# Patient Record
Sex: Female | Born: 1960 | Race: White | Hispanic: No | Marital: Married
Health system: Southern US, Community
[De-identification: ages and names within clinical notes are randomized; demographics above are authoritative.]

## PROBLEM LIST (undated history)

## (undated) HISTORY — PX: WISDOM TOOTH EXTRACTION: SHX21

## (undated) HISTORY — PX: ENDOMETRIAL ABLATION: SHX621

---

## 2011-01-17 ENCOUNTER — Other Ambulatory Visit: Payer: Self-pay | Admitting: Sports Medicine

## 2011-01-17 DIAGNOSIS — M546 Pain in thoracic spine: Secondary | ICD-10-CM

## 2011-01-20 ENCOUNTER — Ambulatory Visit
Admission: RE | Admit: 2011-01-20 | Discharge: 2011-01-20 | Disposition: A | Discharge status: Home / Self Care | Payer: BC Managed Care – PPO | Source: Ambulatory Visit | Attending: Sports Medicine | Admitting: Sports Medicine

## 2011-01-20 DIAGNOSIS — M546 Pain in thoracic spine: Secondary | ICD-10-CM

## 2011-03-25 ENCOUNTER — Other Ambulatory Visit: Payer: Self-pay | Admitting: Orthopedic Surgery

## 2011-04-02 ENCOUNTER — Ambulatory Visit
Admission: RE | Admit: 2011-04-02 | Discharge: 2011-04-02 | Disposition: A | Discharge status: Home / Self Care | Payer: BC Managed Care – PPO | Source: Ambulatory Visit | Attending: Orthopedic Surgery | Admitting: Orthopedic Surgery

## 2011-04-02 DIAGNOSIS — M40209 Unspecified kyphosis, site unspecified: Secondary | ICD-10-CM

## 2011-04-02 DIAGNOSIS — R9389 Abnormal findings on diagnostic imaging of other specified body structures: Secondary | ICD-10-CM

## 2014-07-28 ENCOUNTER — Other Ambulatory Visit: Payer: Self-pay | Admitting: Obstetrics and Gynecology

## 2014-07-28 DIAGNOSIS — N6452 Nipple discharge: Secondary | ICD-10-CM

## 2014-08-03 ENCOUNTER — Other Ambulatory Visit: Payer: BC Managed Care – PPO

## 2014-08-03 ENCOUNTER — Inpatient Hospital Stay: Admission: RE | Admit: 2014-08-03 | Payer: BC Managed Care – PPO | Source: Ambulatory Visit

## 2014-11-29 ENCOUNTER — Other Ambulatory Visit: Payer: Self-pay

## 2017-01-22 ENCOUNTER — Emergency Department (HOSPITAL_BASED_OUTPATIENT_CLINIC_OR_DEPARTMENT_OTHER): Payer: 59

## 2017-01-22 ENCOUNTER — Emergency Department (HOSPITAL_BASED_OUTPATIENT_CLINIC_OR_DEPARTMENT_OTHER)
Admission: EM | Admit: 2017-01-22 | Discharge: 2017-01-22 | Disposition: A | Discharge status: Home / Self Care | Payer: 59 | Attending: Physician Assistant | Admitting: Physician Assistant

## 2017-01-22 ENCOUNTER — Encounter (HOSPITAL_BASED_OUTPATIENT_CLINIC_OR_DEPARTMENT_OTHER): Payer: Self-pay | Admitting: *Deleted

## 2017-01-22 DIAGNOSIS — R51 Headache: Secondary | ICD-10-CM | POA: Insufficient documentation

## 2017-01-22 DIAGNOSIS — M79602 Pain in left arm: Secondary | ICD-10-CM | POA: Diagnosis not present

## 2017-01-22 DIAGNOSIS — R52 Pain, unspecified: Secondary | ICD-10-CM

## 2017-01-22 DIAGNOSIS — R079 Chest pain, unspecified: Secondary | ICD-10-CM | POA: Insufficient documentation

## 2017-01-22 DIAGNOSIS — M79605 Pain in left leg: Secondary | ICD-10-CM | POA: Insufficient documentation

## 2017-01-22 LAB — COMPREHENSIVE METABOLIC PANEL
ALT: 30 U/L (ref 14–54)
AST: 23 U/L (ref 15–41)
Albumin: 3.7 g/dL (ref 3.5–5.0)
Alkaline Phosphatase: 82 U/L (ref 38–126)
Anion gap: 8 (ref 5–15)
BILIRUBIN TOTAL: 0.4 mg/dL (ref 0.3–1.2)
BUN: 13 mg/dL (ref 6–20)
CALCIUM: 8.9 mg/dL (ref 8.9–10.3)
CO2: 25 mmol/L (ref 22–32)
CREATININE: 0.79 mg/dL (ref 0.44–1.00)
Chloride: 104 mmol/L (ref 101–111)
GFR calc Af Amer: >60 mL/min (ref >60)
Glucose, Bld: 96 mg/dL (ref 65–99)
POTASSIUM: 4.1 mmol/L (ref 3.5–5.1)
SODIUM: 137 mmol/L (ref 135–145)
TOTAL PROTEIN: 6.6 g/dL (ref 6.5–8.1)

## 2017-01-22 LAB — CBC WITH DIFFERENTIAL/PLATELET
BASOS ABS: 0 10*3/uL (ref 0.0–0.1)
BASOS PCT: 0 %
EOS ABS: 0.2 10*3/uL (ref 0.0–0.7)
EOS PCT: 4 %
HCT: 40.2 % (ref 36.0–46.0)
Hemoglobin: 13.2 g/dL (ref 12.0–15.0)
Lymphocytes Relative: 17 %
Lymphs Abs: 0.8 10*3/uL (ref 0.7–4.0)
MCH: 28.4 pg (ref 26.0–34.0)
MCHC: 32.8 g/dL (ref 30.0–36.0)
MCV: 86.5 fL (ref 78.0–100.0)
Monocytes Absolute: 0.3 10*3/uL (ref 0.1–1.0)
Monocytes Relative: 7 %
Neutro Abs: 3.6 10*3/uL (ref 1.7–7.7)
Neutrophils Relative %: 72 %
PLATELETS: 208 10*3/uL (ref 150–400)
RBC: 4.65 MIL/uL (ref 3.87–5.11)
RDW: 13.3 % (ref 11.5–15.5)
WBC: 4.9 10*3/uL (ref 4.0–10.5)

## 2017-01-22 LAB — TROPONIN I

## 2017-01-22 MED ORDER — IBUPROFEN 800 MG PO TABS: 800.0000 mg | ORAL_TABLET | Freq: Three times a day (TID) | ORAL | 0 refills | Status: AC

## 2017-01-22 MED ORDER — CYCLOBENZAPRINE HCL 10 MG PO TABS: 10.0000 mg | ORAL_TABLET | Freq: Two times a day (BID) | ORAL | 0 refills | Status: AC | PRN

## 2017-01-22 MED FILL — CYCLOBENZAPRINE 10 MG TAB: 10 | 5 days supply | Qty: 10 | Fill #0

## 2017-01-22 MED FILL — IBUPROFEN 600 MG TABLET: 600 | 7 days supply | Qty: 21 | Fill #0

## 2017-01-22 NOTE — Discharge Instructions (Signed)
We are unsure what is causing the pain in the left side of your body. Please use these prescription to help with your pain. Please return immediately if you have any numbness weakness or other neurologic symptoms. Other fall otherwise follow-up with your primary neurology for your intermittent symptoms.

## 2017-01-22 NOTE — ED Notes (Addendum)
Vitals signs from 10:42 inaccurate, HR and SpO2 not trending with other vitals

## 2017-01-22 NOTE — ED Triage Notes (Signed)
Pt reports left side (full side of body) pain x 5 days. Headache also on the left side. C/o swelling left leg swelling also. States she has had recurrent episodes since January

## 2017-01-22 NOTE — ED Provider Notes (Signed)
[facility]   CSN: [id number] Arrival date & time: 01/22/17  0934     History   Chief Complaint Chief Complaint  Patient presents with  . Extremity Pain    HPI Emily Pineda is a 56 y.o. female.  HPI   Patient is a 56 year old female presenting with multiple complaints. Patient was sent here by her PCP for DVT rule out. She reports that her left leg sometimes swells. She notes swelling in the left leg today.. Patient reports that intermittent.  Patient also has pain on the whole left side of her body. She reports this been going on since January. She's been seen by multiple physicians for this and they've been unable to establish why. Patient says sometimes she has numbness and tingling in her left head and left arm. This comes and goes. Patient also reports that she has chest pain or left-sided chest.   She called her PCP about this today and was told to come here in case she was having a heart attack. Patient's very anxious and concerned about both heart attack and blood clot at this time.  Patient had no recent travel. Patient has no risk factors for ischemia.  Chest pain happens sometimes and not others, difficult to describe. Nonexertional not associate with shortness of breath or diaphoresis. Patient states that it's really been going on since January along with the pain on the whole left side of her body.  History reviewed. No pertinent past medical history.  There are no active problems to display for this patient.   Past Surgical History:  Procedure Laterality Date  . CESAREAN SECTION    . ENDOMETRIAL ABLATION    . WISDOM TOOTH EXTRACTION      OB History    No data available       Home Medications    Prior to Admission medications   Not on File    Family History No family history on file.  Social History Social History  Substance Use Topics  . Smoking status: Never Smoker  . Smokeless tobacco: Never Used  . Alcohol use  No     Allergies   Other   Review of Systems Review of Systems  Constitutional: Negative for activity change.  Respiratory: Negative for shortness of breath.   Cardiovascular: Negative for chest pain.  Gastrointestinal: Negative for abdominal pain.     Physical Exam Updated Vital Signs BP (!) 150/96 (BP Location: Left Arm)   Pulse 84   Temp 97.9 F (36.6 C) (Oral)   Resp 18   Ht  (1.575 m)   Wt 162 lb (73.5 kg)   SpO2 100%   BMI 29.63 kg/m   Physical Exam  Constitutional: She is oriented to person, place, and time. She appears well-developed and well-nourished.  HENT:  Head: Normocephalic and atraumatic.  Eyes: Right eye exhibits no discharge.  Cardiovascular: Normal rate, regular rhythm and normal heart sounds.   No murmur heard. Pulmonary/Chest: Effort normal and breath sounds normal. She has no wheezes. She has no rales.  Abdominal: Soft. She exhibits no distension. There is no tenderness.  Musculoskeletal:  No swelling to either extremity. Normal pulses. Normal sensation. Normal range of motion bilateral lower extremities and upper extremities.  Neurological: She is oriented to person, place, and time.  Equal strength bilaterally upper and lower extremities negative pronator drift. Normal sensation bilaterally. Speech comprehensible, no slurring. Facial nerve tested and appears grossly normal. Alert and oriented 3.   Skin: Skin  is warm and dry. She is not diaphoretic.  Psychiatric: She has a normal mood and affect.  Nursing note and vitals reviewed.    ED Treatments / Results  Labs (all labs ordered are listed, but only abnormal results are displayed) Labs Reviewed  COMPREHENSIVE METABOLIC PANEL  CBC WITH DIFFERENTIAL/PLATELET  TROPONIN I    EKG  EKG Interpretation None       Radiology No results found.  Procedures Procedures (including critical care time)  Medications Ordered in ED Medications - No data to display   Initial  Impression / Assessment and Plan / ED Course  I have reviewed the triage vital signs and the nursing notes.  Pertinent labs & imaging results that were available during my care of the patient were reviewed by me and considered in my medical decision making (see chart for details).     Patient well-appearing 56 year old female presenting with  constellation of symptoms. Patient had left-sided pain occasional tingling to the whole left side of her body including face arm and leg. This been going on since January. Do not suggest anything acute. Patient's been seen by her physician for this. Was just follow up with her primary care and have a neurology referral as needed. Patient does complain of occasional chest pains for the last several months. We will get initial troponin, EKG, chest x-ray. Patient also reports chills and fevers for one hour today. We'll determine whether she has large infection by getting an initial blood work. Patient also complains of left lower extremity swelling. We will get ultrasound relative DVT.   We'll have her follow-up with her primary care physician to continue to address this.  1:12 PM Also negative chest x-ray negative labs reassuring. Patient did have one wrong set of vital signs placed at 1042. Otherwise she's been completely fine. This is still complaining of pain on the left side of her body from the top of her headto her foot. And occasional numbness. Since this is comign/going since January do not believe represents an emergent pathology. We'll have her follow-up with  her primary care physician or neurology  Patient is comfortable, ambulatory, and taking PO at time of discharge.  Patient expressed understanding about return precautions.    Final Clinical Impressions(s) / ED Diagnoses   Final diagnoses:  None    New Prescriptions New Prescriptions   No medications on file     Marisol Glazer Randall An, MD 01/22/17 1313

## 2017-01-22 NOTE — ED Notes (Signed)
ED Provider at bedside. 

## 2019-02-05 IMAGING — US US EXTREM LOW VENOUS*L*
1 series · 13 of 24 positions shown · non-contrast
Comparison: None.

CLINICAL DATA: Chronic pain

EXAM:
LEFT LOWER EXTREMITY VENOUS DUPLEX ULTRASOUND
TECHNIQUE: Gray-scale sonography with graded compression, as well as color
Doppler and duplex ultrasound were performed to evaluate the left
lower extremity deep venous system from the level of the common
femoral vein and including the common femoral, femoral, profunda
femoral, popliteal and calf veins including the posterior tibial,
peroneal and gastrocnemius veins when visible. The superficial great
saphenous vein was also interrogated. Spectral Doppler was utilized
to evaluate flow at rest and with distal augmentation maneuvers in
the common femoral, femoral and popliteal veins.

[Series 1: us extrem low venous*left* · 0.08mm/px · 13 of 27 slices shown]
[im 1/27]
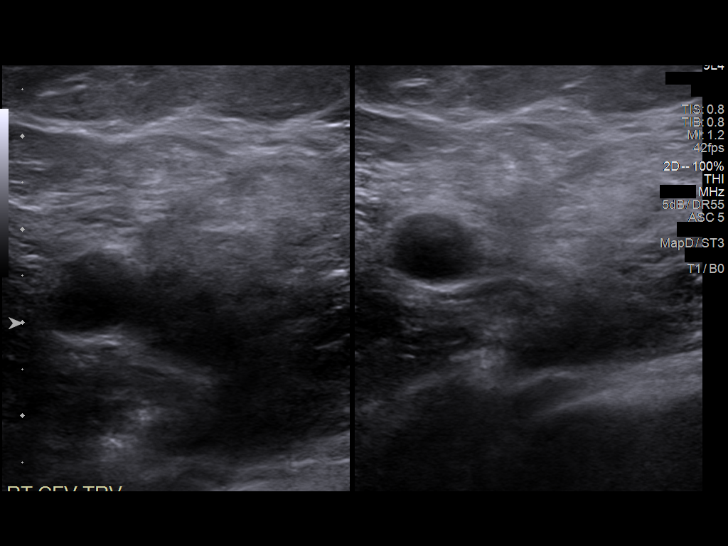
[im 3/27]
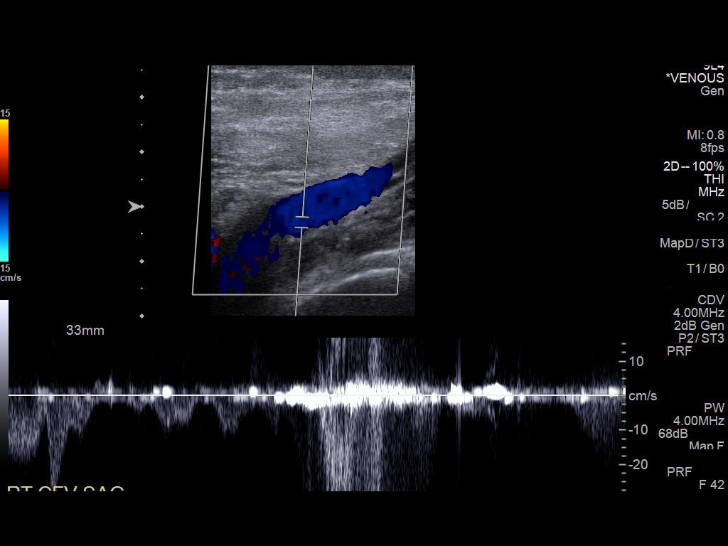
[im 5/27]
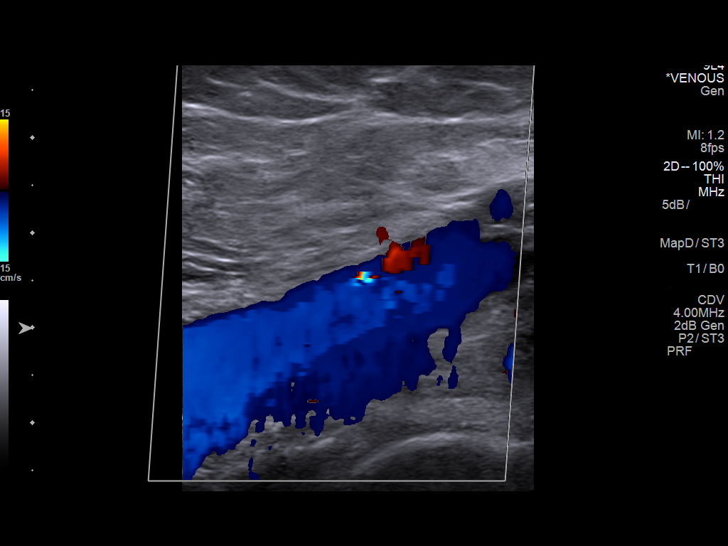
[im 7/27]
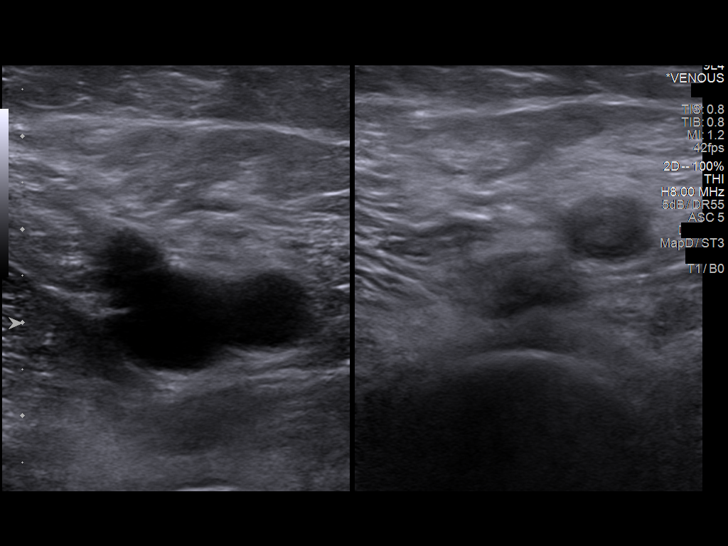
[im 10/27]
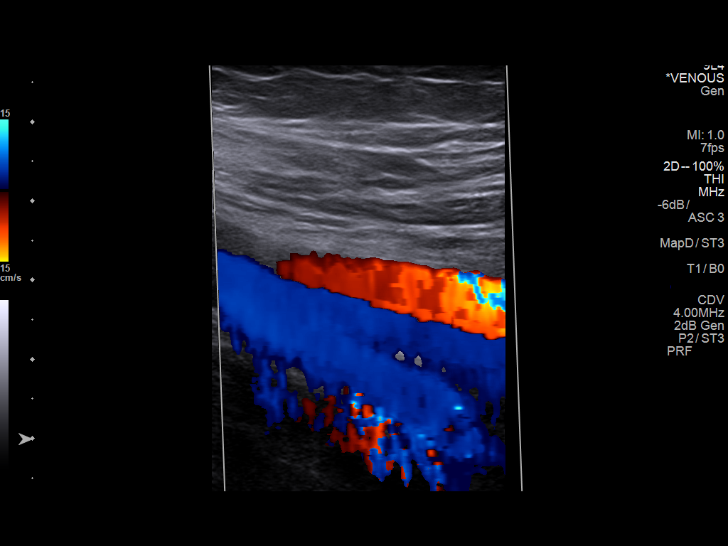
[im 12/27]
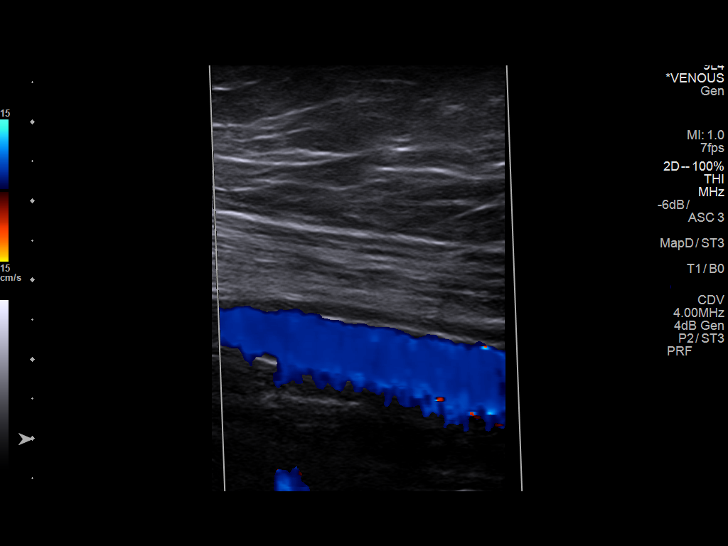
[im 14/27]
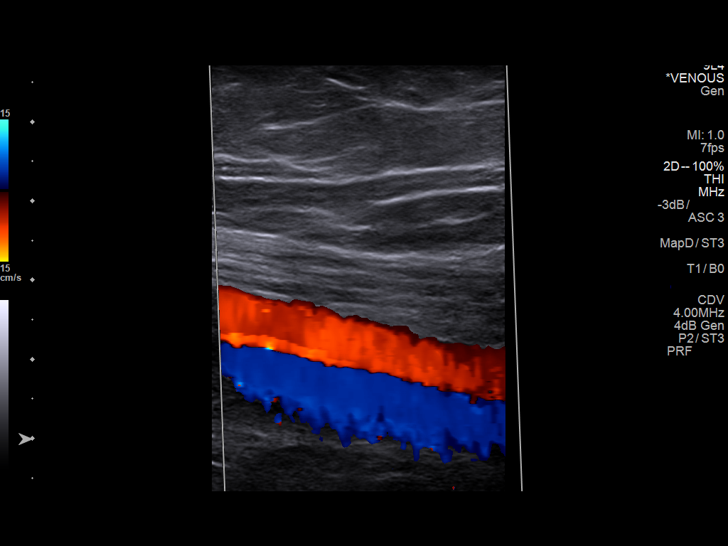
[im 15/27]
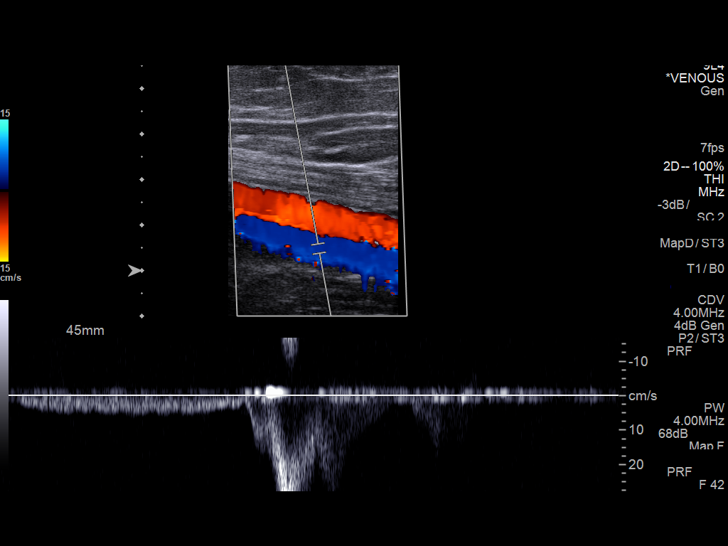
[im 17/27]
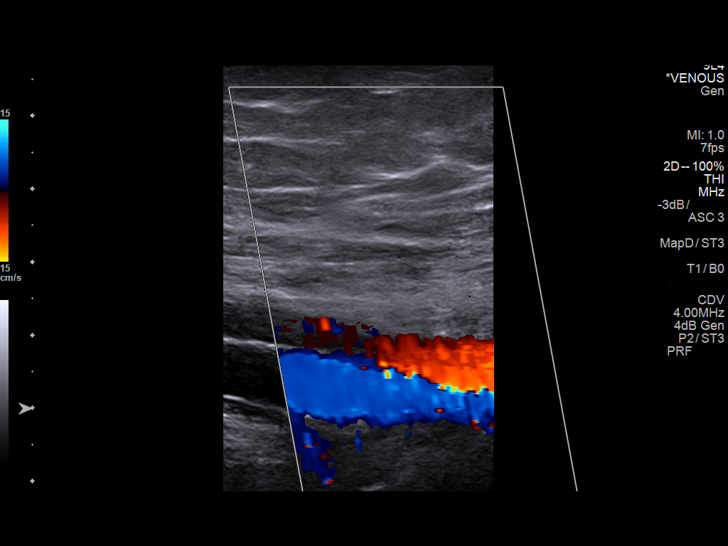
[im 20/27]
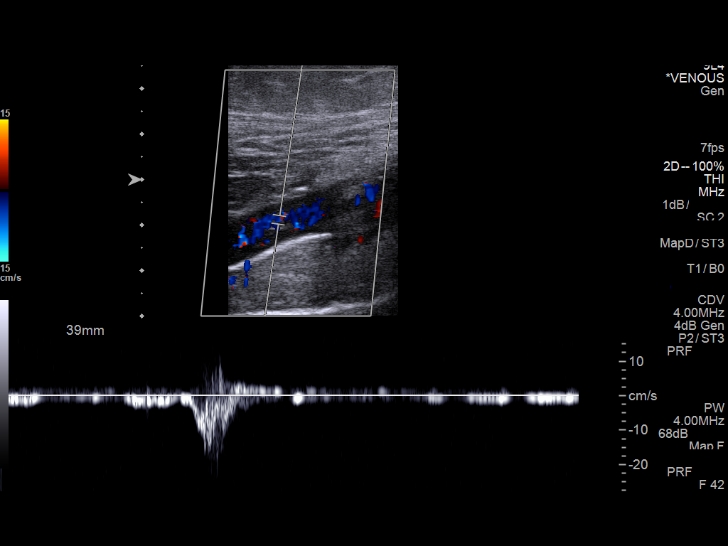
[im 22/27]
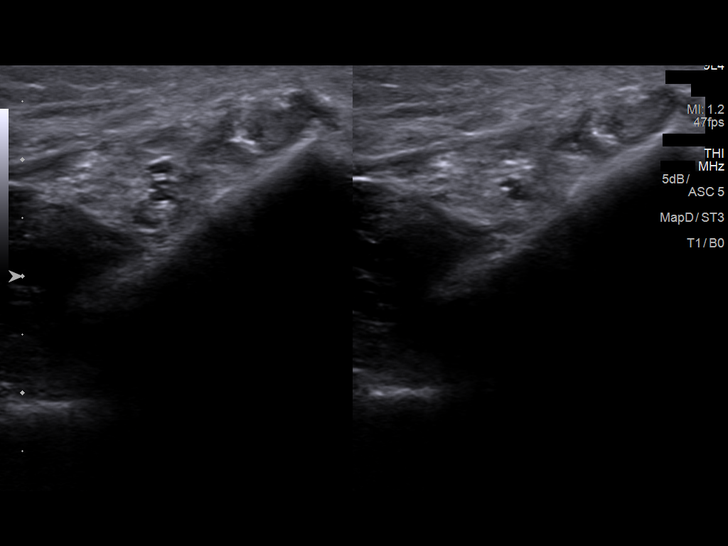
[im 24/27]
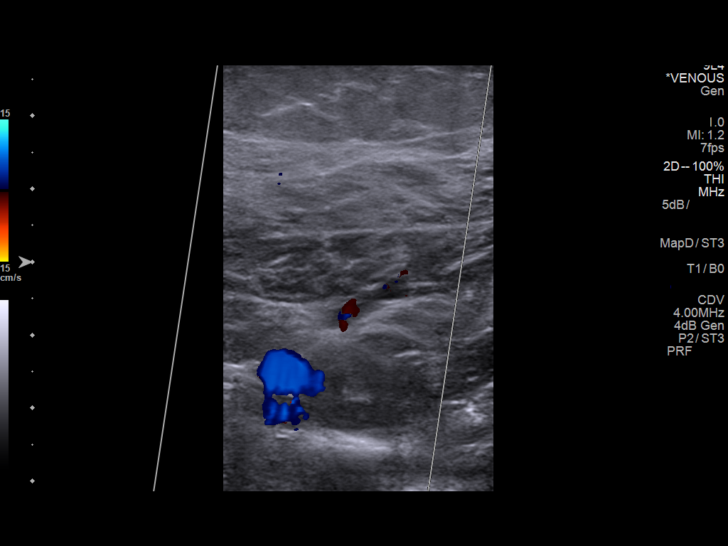
[im 27/27]
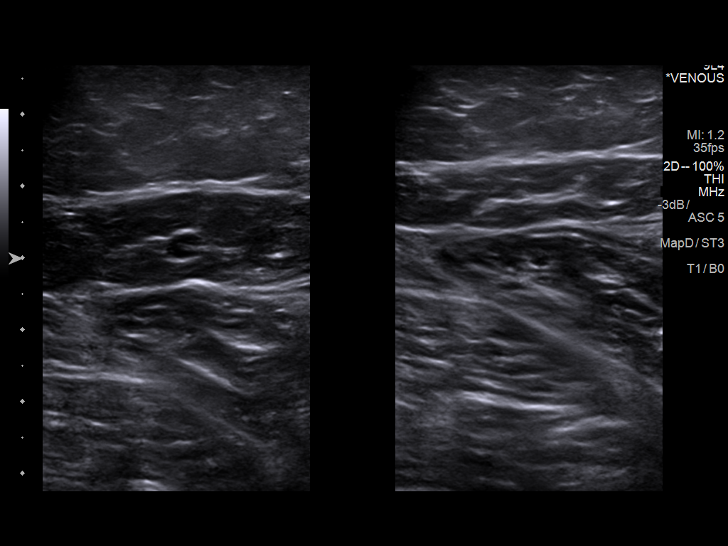

[13 of 24 positions shown; findings below may reference images not displayed]

FINDINGS: Contralateral Common Femoral Vein: Respiratory phasicity is normal
and symmetric with the symptomatic side. No evidence of thrombus.
Normal compressibility.

Common Femoral Vein: No evidence of thrombus. Normal
compressibility, respiratory phasicity and response to augmentation.

Saphenofemoral Junction: No evidence of thrombus. Normal
compressibility and flow on color Doppler imaging.

Profunda Femoral Vein: No evidence of thrombus. Normal
compressibility and flow on color Doppler imaging.

Femoral Vein: No evidence of thrombus. Normal compressibility,
respiratory phasicity and response to augmentation.

Popliteal Vein: No evidence of thrombus. Normal compressibility,
respiratory phasicity and response to augmentation.

Calf Veins: No evidence of thrombus. Normal compressibility and flow
on color Doppler imaging.

Superficial Great Saphenous Vein: No evidence of thrombus. Normal
compressibility and flow on color Doppler imaging.

Venous Reflux:  None.

Other Findings:  None.
IMPRESSION: No evidence of left lower extremity deep venous thrombosis. Right
common femoral vein also patent.

## 2021-01-01 ENCOUNTER — Encounter (HOSPITAL_COMMUNITY): Payer: 59

## 2021-01-02 ENCOUNTER — Other Ambulatory Visit (HOSPITAL_COMMUNITY): Payer: Self-pay | Admitting: Sports Medicine

## 2021-01-02 ENCOUNTER — Ambulatory Visit (HOSPITAL_COMMUNITY)
Admission: RE | Admit: 2021-01-02 | Discharge: 2021-01-02 | Disposition: A | Discharge status: Home / Self Care | Payer: Self-pay | Source: Ambulatory Visit | Attending: Sports Medicine | Admitting: Sports Medicine

## 2021-01-02 DIAGNOSIS — R600 Localized edema: Secondary | ICD-10-CM

## 2021-12-13 ENCOUNTER — Ambulatory Visit (INDEPENDENT_AMBULATORY_CARE_PROVIDER_SITE_OTHER): Payer: Self-pay | Admitting: Podiatry

## 2021-12-13 DIAGNOSIS — Z91199 Patient's noncompliance with other medical treatment and regimen due to unspecified reason: Secondary | ICD-10-CM

## 2021-12-17 NOTE — Progress Notes (Signed)
Error

## 2022-08-20 ENCOUNTER — Other Ambulatory Visit: Payer: Self-pay | Admitting: Obstetrics and Gynecology

## 2022-08-20 DIAGNOSIS — N644 Mastodynia: Secondary | ICD-10-CM

## 2022-09-02 ENCOUNTER — Other Ambulatory Visit: Payer: Self-pay

## 2022-09-08 ENCOUNTER — Other Ambulatory Visit: Payer: Self-pay

## 2022-10-08 ENCOUNTER — Ambulatory Visit
Admission: RE | Admit: 2022-10-08 | Discharge: 2022-10-08 | Disposition: A | Discharge status: Home / Self Care | Payer: Commercial Managed Care - PPO | Source: Ambulatory Visit | Attending: Obstetrics and Gynecology | Admitting: Obstetrics and Gynecology

## 2022-10-08 ENCOUNTER — Ambulatory Visit: Payer: Self-pay

## 2022-10-08 DIAGNOSIS — N644 Mastodynia: Secondary | ICD-10-CM

## 2022-10-21 ENCOUNTER — Other Ambulatory Visit: Payer: Self-pay

## 2024-03-28 ENCOUNTER — Ambulatory Visit (INDEPENDENT_AMBULATORY_CARE_PROVIDER_SITE_OTHER): Admitting: Podiatry

## 2024-03-28 DIAGNOSIS — Z91199 Patient's noncompliance with other medical treatment and regimen due to unspecified reason: Secondary | ICD-10-CM

## 2024-03-28 NOTE — Progress Notes (Signed)
 No show

## 2024-04-07 ENCOUNTER — Ambulatory Visit (INDEPENDENT_AMBULATORY_CARE_PROVIDER_SITE_OTHER): Admitting: Podiatry

## 2024-04-07 DIAGNOSIS — Z91199 Patient's noncompliance with other medical treatment and regimen due to unspecified reason: Secondary | ICD-10-CM

## 2024-04-10 NOTE — Progress Notes (Signed)
 Patient did not show for scheduled appointment today. Two consecutive no shows within the practice.

## 2024-10-04 ENCOUNTER — Ambulatory Visit: Admitting: Allergy

## 2024-10-04 ENCOUNTER — Ambulatory Visit: Payer: Self-pay | Admitting: Allergy

## 2024-10-04 ENCOUNTER — Encounter: Payer: Self-pay | Admitting: Allergy

## 2024-10-04 ENCOUNTER — Ambulatory Visit (HOSPITAL_BASED_OUTPATIENT_CLINIC_OR_DEPARTMENT_OTHER)
Admission: RE | Admit: 2024-10-04 | Discharge: 2024-10-04 | Disposition: A | Source: Ambulatory Visit | Attending: Allergy | Admitting: Radiology

## 2024-10-04 VITALS — BP 150/100 | HR 85 | Temp 98.0°F | Ht 60.0 in | Wt 166.5 lb

## 2024-10-04 DIAGNOSIS — J988 Other specified respiratory disorders: Secondary | ICD-10-CM | POA: Insufficient documentation

## 2024-10-04 DIAGNOSIS — J4541 Moderate persistent asthma with (acute) exacerbation: Secondary | ICD-10-CM

## 2024-10-04 DIAGNOSIS — J3089 Other allergic rhinitis: Secondary | ICD-10-CM

## 2024-10-04 DIAGNOSIS — B999 Unspecified infectious disease: Secondary | ICD-10-CM | POA: Diagnosis not present

## 2024-10-04 DIAGNOSIS — R030 Elevated blood-pressure reading, without diagnosis of hypertension: Secondary | ICD-10-CM

## 2024-10-04 MED ORDER — BUDESONIDE-FORMOTEROL FUMARATE 160-4.5 MCG/ACT IN AERO: 2.0000 | INHALATION_SPRAY | Freq: Two times a day (BID) | RESPIRATORY_TRACT | 3 refills | Status: AC

## 2024-10-04 MED ORDER — LEVALBUTEROL HCL 0.63 MG/3ML IN NEBU: 0.6300 mg | INHALATION_SOLUTION | RESPIRATORY_TRACT | 1 refills | Status: AC | PRN

## 2024-10-04 MED ORDER — METHYLPREDNISOLONE 4 MG PO TBPK: ORAL_TABLET | ORAL | 0 refills | Status: DC

## 2024-10-04 MED ORDER — FLUTICASONE PROPIONATE 50 MCG/ACT NA SUSP: 1.0000 | Freq: Every day | NASAL | 5 refills | Status: AC | PRN

## 2024-10-04 MED ORDER — LEVALBUTEROL TARTRATE 45 MCG/ACT IN AERO: 2.0000 | INHALATION_SPRAY | RESPIRATORY_TRACT | 2 refills | Status: AC | PRN

## 2024-10-04 NOTE — Progress Notes (Signed)
 Please call patient.  Normal chest X-ray. No need for antibiotics.

## 2024-10-04 NOTE — Patient Instructions (Addendum)
 Breathing Get chest X-ray At [facility].   Start medrol  pak. Daily controller medication(s): START Symbicort  160mcg 2 puffs twice a day with spacer and rinse mouth afterwards. Stop Flovent  for now. Stop albuterol for now.  Spacer given and demonstrated proper use with inhaler. Patient understood technique and all questions/concerned were addressed.   During respiratory infections/flares:  Use levoalbuterol nebulizer.  If you need to use your levoalbuterol nebulizer machine back to back within 15-30 minutes with no relief then please go to the ER/urgent care for further evaluation.  May use levoalbuterol rescue inhaler 2 puffs or nebulizer every 4 to 6 hours as needed for shortness of breath, chest tightness, coughing, and wheezing. May use albuterol rescue inhaler 2 puffs 5 to 15 minutes prior to strenuous physical activities. Monitor frequency of use - if you need to use it more than twice per week on a consistent basis let us  know.  Breathing control goals:  Full participation in all desired activities (may need albuterol before activity) Albuterol use two times or less a week on average (not counting use with activity) Cough interfering with sleep two times or less a month Oral steroids no more than once a year No hospitalizations   Rhinitis Use over the counter antihistamines such as Zyrtec (cetirizine), Claritin (loratadine), Allegra (fexofenadine), or Xyzal (levocetirizine) daily as needed. May switch antihistamines every few months. Use Flonase  (fluticasone ) nasal spray 1-2 sprays per nostril once a day as needed for nasal congestion.  Nasal saline spray (i.e., Simply Saline) or nasal saline lavage (i.e., NeilMed) is recommended as needed and prior to medicated nasal sprays. Will plan on allergy testing once your asthma is more stable.   Infections Keep track of infections and antibiotics use. If persistent will get bloodwork next to look at immune system.   Elevated  blood pressure  Blood pressure reading was high in our office today. Vitals:   10/04/24 1507  BP: (!) 150/100   Please follow up with PCP regarding this.    Follow up in 4 weeks or sooner if needed.

## 2024-10-04 NOTE — Progress Notes (Signed)
 New Patient Note  RE: Emily Pineda MRN: [id number] DOB: 07/16/1961 Date of Office Visit: 10/04/2024  Consult requested by: Millicent Sharper, MD Primary care provider: Millicent Sharper, MD  Chief Complaint: Cough (Given flovent - noticed no improvement/Albuterol nebulizer cause jitteriness /Voice hoarseness since October ) and Allergic Rhinitis  (Sinus Issues)  History of Present Illness: I had the pleasure of seeing Emily Pineda for initial evaluation at the [facility] on 10/04/2024. She is a 63 y.o. female, who is self-referred here for the evaluation of asthma and allergic rhinitis. She is accompanied today by her son who provided/contributed to the history.   Discussed the use of AI scribe software for clinical note transcription with the patient, who gave verbal consent to proceed.    She has experienced intermittent respiratory symptoms for approximately 20 years, with worsening over the past three years. Episodes of cough, sinus congestion, and chest tightness occur every four to five weeks, often accompanied by wheezing and shortness of breath. These symptoms have been more severe since October, with increased frequency and intensity.  She uses an albuterol inhaler as needed and recently started Flovent  twice daily about a week ago. While these medications provide temporary relief, symptoms tend to recur. She also uses Flonase  nasal spray daily for sinus congestion, which she finds helpful.  In the past year, she has visited urgent care two to three times for breathing difficulties and was prescribed prednisone once, which provided some relief. She underwent sinus surgery last year; she reports that her nose is better after the surgery, but her sinus issues have persisted. She reports frequent sinus infections requiring antibiotics, though she is unsure if she has had any this year.  Exacerbations of her symptoms occur in October and March, with additional triggers  including colds and sinus infections. No smoking history and no known allergies to medications. She occasionally experiences reflux and takes over-the-counter medications as needed.  She has a history of pneumonia about four to five years ago and has not been hospitalized overnight for breathing problems in the past two years. She experienced chills and fever about four to five days ago, but recent COVID and flu tests were negative.      She reports symptoms of lost of her voice, chest tightness, shortness of breath, coughing, wheezing for 20+ years and worsening the past 3 years. Current medications include Flovent  110mcg 2 puffs BID and albuterol prn with some benefit. She reports not using aerochamber with inhalers. She tried the following inhalers: albuterol neb causes palpitations. Main triggers are infection, cold air  In the last month, frequency of symptoms: daily. Frequency of nocturnal symptoms: depends. Frequency of SABA use: daily for the past 2 weeks. Interference with physical activity: yes. Sleep is disturbed. In the last 12 months, emergency room visits/urgent care visits/doctor office visits or hospitalizations due to respiratory issues: 2-3 times. In the last 12 months, oral steroids courses: 1. Lifetime history of hospitalization for respiratory issues: no. Prior intubations: no. History of pneumonia: yes. She was not evaluated by allergist/pulmonologist in the past. Smoking exposure: no.  History of reflux: sometimes and takes otc meds prn.  She reports symptoms of nasal congestion, rhinorrhea, itchy/watery eyes. Symptoms have been going on for 20+ years. The symptoms are present all year around with worsening in October, winter, March. Anosmia: no. Headache: yes. She has used Flonase , allegra, xyzal, claritin, Flonase  1 spray BID with some improvement in symptoms. Sinus infections: not sure. Previous work up includes:  no. Previous ENT evaluation: yes and had sinus surgery. Previous  sinus imaging:  2024 CT sinus FINDINGS:  # Sinuses:  #  Frontal: Mild-to-moderate mucosal thickening bilaterally. Mucosal thickening occludes the frontal recesses bilaterally. No air-fluid levels..  #  Ethmoid: Mild-to-moderate mucosal thickening bilaterally..  #  Maxillary: Moderate circumferential mucosal thickening right maxillary sinus. Mild circumferential thickening left maxillary sinus. No air-fluid levels.. Mucosal thickening occludes the right maxillary ostium and infundibulum. The distal left maxillary infundibulum is severely narrowed and may be occluded as well..  #  Sphenoid: Mild mucosal thickening bilaterally. No air-fluid levels..  #  Nasal cavities: No mass appreciated.SABRA Leftward nasal septal deviation..  #  Bones: No aggressive bony changes or acute fracture.  #  Additional comments: No mass appreciated. Visualized intracranial structures are unremarkable.SABRA   IMPRESSION:  1. Pansinus disease. No air-fluid levels.  2.  Leftward nasal septal deviation.  SABRA History of nasal polyps: No?.  09/28/2024 UC visit: Moderate persistent asthma with acute exacerbation (*) - predniSONE (DELTASONE) 20 mg tablet; 3 po qd for 3 days, 2 po qd for 3 days, 1 po qd for 3 days, and 1/2 po qd for 3 days  Acute cough - POCT Flu Nucleic Acid - ipratropium-albuterol (DUONEB) nebulizer solution 0.5-2.5 mg/3 mL  Mild intermittent asthma with acute exacerbation (*) - Discontinue: albuterol sulfate (PROVENTIL) 2.5 mg/3 mL nebulizer solution; Take 3 mLs (2.5 mg dose) by nebulization every 6 (six) hours as needed for Wheezing. - albuterol sulfate (PROVENTIL) 2.5 mg/3 mL nebulizer solution; Take 3 mLs (2.5 mg dose) by nebulization every 6 (six) hours as needed for Wheezing.  Other orders - Discontinue: albuterol sulfate HFA (PROVENTIL,VENTOLIN,PROAIR) 108 (90 Base) MCG/ACT inhaler; Inhale one puff into the lungs every 6 (six) hours as needed for Wheezing. - albuterol sulfate HFA  (PROVENTIL,VENTOLIN,PROAIR) 108 (90 Base) MCG/ACT inhaler; Inhale one puff into the lungs every 6 (six) hours as needed for Wheezing.   She reported significant coughing and mucus production starting this past Sunday, lasting for 3 days. She used Robitussin and a nebulizer with albuterol but continued to experience symptoms. She also had a fever on Sunday and Monday. A swab test for influenza will be conducted to rule out the possibility of flu. A breathing treatment will be administered during this visit. She is advised to use her Flovent  inhaler twice daily, once in the morning and once at night, to prevent further flare-ups. If she continues to experience flare-ups despite the use of the inhaler, a referral to a pulmonologist will be considered.  Portions of the history and exam were entered using voice recognition software. Minor syntax, contextual, and spelling errors may be related to the use of this software and were not intentional. If corrections are necessary, please contact provider.   Assessment and Plan: Emily Pineda is a 63 y.o. female with: Not well controlled moderate persistent asthma with acute exacerbation Respiratory infection Asthma symptoms for many years and just recently started on daily Flovent . Uses albuterol prn and recently finished prednisone with some benefit. Albuterol causes palpitations and shaking which led to pouring hot water on herself causing skin damage.  Today's spirometry showed possible restrictive disease with 8% and 90cc improvement in FEV1 post bronchodilator treatment. Clinically feeling improved.  Get CXR. Start medrol  pak. Daily controller medication(s): START Symbicort  160mcg 2 puffs twice a day with spacer and rinse mouth afterwards. Stop Flovent  for now. Stop albuterol for now.  Spacer given and demonstrated proper use with inhaler. Patient understood technique and  all questions/concerned were addressed.  Patient had poor inhaler technique.  During  respiratory infections/flares:  Use levoalbuterol nebulizer.  If you need to use your levoalbuterol nebulizer machine back to back within 15-30 minutes with no relief then please go to the ER/urgent care for further evaluation.  May use levoalbuterol rescue inhaler 2 puffs or nebulizer every 4 to 6 hours as needed for shortness of breath, chest tightness, coughing, and wheezing. May use albuterol rescue inhaler 2 puffs 5 to 15 minutes prior to strenuous physical activities. Monitor frequency of use - if you need to use it more than twice per week on a consistent basis let us  know.   Other allergic rhinitis Chronic allergic rhinitis with seasonal exacerbations. Current Flonase  use provides relief. S/p sinus surgery. Use over the counter antihistamines such as Zyrtec (cetirizine), Claritin (loratadine), Allegra (fexofenadine), or Xyzal (levocetirizine) daily as needed. May switch antihistamines every few months. Use Flonase  (fluticasone ) nasal spray 1-2 sprays per nostril once a day as needed for nasal congestion.  Nasal saline spray (i.e., Simply Saline) or nasal saline lavage (i.e., NeilMed) is recommended as needed and prior to medicated nasal sprays. Will plan on allergy testing once your asthma is more stable.   Recurrent sinus infections Keep track of infections and antibiotics use. If persistent will get bloodwork next to look at immune system.   Elevated blood pressure reading Please follow up with PCP regarding this.    Return in about 4 weeks (around 11/01/2024).  Meds ordered this encounter  Medications   levalbuterol  (XOPENEX ) 0.63 MG/3ML nebulizer solution    Sig: Take 3 mLs (0.63 mg total) by nebulization every 4 (four) hours as needed for wheezing or shortness of breath (coughing fits).    Dispense:  75 mL    Refill:  1   levalbuterol  (XOPENEX  HFA) 45 MCG/ACT inhaler    Sig: Inhale 2 puffs into the lungs every 4 (four) hours as needed for wheezing or shortness of breath  (coughing fits).    Dispense:  1 each    Refill:  2   budesonide -formoterol  (SYMBICORT ) 160-4.5 MCG/ACT inhaler    Sig: Inhale 2 puffs into the lungs in the morning and at bedtime. with spacer and rinse mouth afterwards.    Dispense:  1 each    Refill:  3   methylPREDNISolone  (MEDROL  DOSEPAK) 4 MG TBPK tablet    Sig: Take 6 tablets on day 1, 5 tablets on day 2, 4 tabs on day 3, 3 tabs on day 4, 2 tabs on day 5, 1 tab on day 6.    Dispense:  21 tablet    Refill:  0   fluticasone  (FLONASE ) 50 MCG/ACT nasal spray    Sig: Place 1-2 sprays into both nostrils daily as needed (nasal congestion).    Dispense:  16 g    Refill:  5   Lab Orders  No laboratory test(s) ordered today    Other allergy screening: Food allergy: no Medication allergy: no Hymenoptera allergy: no Urticaria: no Eczema: yes History of recurrent infections suggestive of immunodeficency:  Frequent sinus infections.   Diagnostics: Spirometry:  Tracings reviewed. Her effort: It was hard to get consistent efforts and there is a question as to whether this reflects a maximal maneuver. FVC: 1.58L FEV1: 1.11L, 55% predicted FEV1/FVC ratio: 70% Interpretation: Spirometry consistent with possible restrictive disease with 8% and 90cc improvement in FEV1 post bronchodilator treatment. Clinically feeling improved.   Please see scanned spirometry results for details.  Results discussed with  patient/family.   Past Medical History: There are no active problems to display for this patient.  Past Medical History:  Diagnosis Date   Asthma    Eczema    Past Surgical History: Past Surgical History:  Procedure Laterality Date   CESAREAN SECTION     ENDOMETRIAL ABLATION     SINOSCOPY     WISDOM TOOTH EXTRACTION     Medication List:  Current Outpatient Medications  Medication Sig Dispense Refill   budesonide -formoterol  (SYMBICORT ) 160-4.5 MCG/ACT inhaler Inhale 2 puffs into the lungs in the morning and at bedtime.  with spacer and rinse mouth afterwards. 1 each 3   cephALEXin (KEFLEX) 250 MG capsule Take by mouth.     Cholecalciferol (VITAMIN D3) 10 MCG (400 UNIT) tablet Take 400 Units by mouth.     cyclobenzaprine  (FLEXERIL ) 10 MG tablet Take 1 tablet (10 mg total) by mouth 2 (two) times daily as needed for muscle spasms. 10 tablet 0   fluticasone  (FLONASE ) 50 MCG/ACT nasal spray Place 1-2 sprays into both nostrils daily as needed (nasal congestion). 16 g 5   ibuprofen  (ADVIL ,MOTRIN ) 800 MG tablet Take 1 tablet (800 mg total) by mouth 3 (three) times daily. 21 tablet 0   levalbuterol  (XOPENEX  HFA) 45 MCG/ACT inhaler Inhale 2 puffs into the lungs every 4 (four) hours as needed for wheezing or shortness of breath (coughing fits). 1 each 2   levalbuterol  (XOPENEX ) 0.63 MG/3ML nebulizer solution Take 3 mLs (0.63 mg total) by nebulization every 4 (four) hours as needed for wheezing or shortness of breath (coughing fits). 75 mL 1   loratadine (CLARITIN) 10 MG tablet Take 10 mg by mouth.     methylPREDNISolone  (MEDROL  DOSEPAK) 4 MG TBPK tablet Take 6 tablets on day 1, 5 tablets on day 2, 4 tabs on day 3, 3 tabs on day 4, 2 tabs on day 5, 1 tab on day 6. 21 tablet 0   silver sulfADIAZINE (SILVADENE) 1 % cream SMARTSIG:1 Topical Daily     No current facility-administered medications for this visit.   Allergies: Allergies[1] Social History: Social History   Socioeconomic History   Marital status: Married    Spouse name: Not on file   Number of children: Not on file   Years of education: Not on file   Highest education level: Not on file  Occupational History   Not on file  Tobacco Use   Smoking status: Never   Smokeless tobacco: Never  Substance and Sexual Activity   Alcohol use: No   Drug use: No   Sexual activity: Not on file  Other Topics Concern   Not on file  Social History Narrative   Not on file   Social Drivers of Health   Tobacco Use: Low Risk (10/04/2024)   Patient History    Smoking  Tobacco Use: Never    Smokeless Tobacco Use: Never    Passive Exposure: Not on file  Financial Resource Strain: Low Risk (11/11/2023)   Received from [facility]   Overall Financial Resource Strain (CARDIA)    Difficulty of Paying Living Expenses: Not hard at all  Food Insecurity: No Food Insecurity (11/11/2023)   Received from [facility]   Epic    Within the past 12 months, you worried that your food would run out before you got the money to buy more.: Never true    Within the past 12 months, the food you bought just didn't last and you didn't have money to get more.: Never  true  Transportation Needs: No Transportation Needs (11/11/2023)   Received from [facility] - Transportation    Lack of Transportation (Medical): No    Lack of Transportation (Non-Medical): No  Physical Activity: Not on file  Stress: Not on file  Social Connections: Not on file  Depression (EYV7-0): Not on file  Alcohol Screen: Not on file  Housing: Low Risk (11/11/2023)   Received [facility]   Epic    At any time in the past 12 months, were you homeless or living in a shelter (including now)?: No    In the last 12 months, was there a time when you were not able to pay the mortgage or rent on time?: No    In the past 12 months, how many times have you moved where you were living?: 1  Utilities: Not At Risk (11/11/2023)   Received from [facility] Utilities    Threatened with loss of utilities: No  Health Literacy: Not on file   Lives in a house. Smoking: denies Occupation: not employed  Environmental HistorySurveyor, Minerals in the house: no Engineer, Civil (consulting) in the family room: no Carpet in the bedroom: no Heating: electric Cooling: central Pet: no  Family History: History reviewed. No pertinent family history. Problem                               Relation Asthma                                   no Eczema                                no Food allergy                           no Allergic rhino conjunctivitis     no  Review of Systems  Constitutional:  Positive for fever. Negative for appetite change, chills and unexpected weight change.  HENT:  Positive for congestion, postnasal drip and rhinorrhea.   Eyes:  Negative for itching.  Respiratory:  Positive for cough, chest tightness, shortness of breath and wheezing.   Cardiovascular:  Negative for chest pain.  Gastrointestinal:  Negative for abdominal pain.  Genitourinary:  Negative for difficulty urinating.  Skin:  Negative for rash.  Neurological:  Negative for headaches.    Objective: BP (!) 150/100   Pulse 85   Temp 98 F (36.7 C)   Ht 5' (1.524 m)   Wt 166 lb 8 oz (75.5 kg)   SpO2 91% Comment: repeat was 96%  BMI 32.52 kg/m  Body mass index is 32.52 kg/m. Physical Exam Vitals and nursing note reviewed.  Constitutional:      Appearance: Normal appearance. She is well-developed.  HENT:     Head: Normocephalic and atraumatic.     Right Ear: Tympanic membrane and external ear normal.     Left Ear: Tympanic membrane and external ear normal.     Nose: Nose normal.     Mouth/Throat:     Mouth: Mucous membranes are moist.     Pharynx: Oropharynx is clear.  Eyes:     Conjunctiva/sclera: Conjunctivae normal.  Cardiovascular:     Rate and Rhythm: Normal rate and regular  rhythm.     Heart sounds: Normal heart sounds. No murmur heard.    No friction rub. No gallop.  Pulmonary:     Effort: Pulmonary effort is normal.     Breath sounds: No wheezing, rhonchi or rales.     Comments: Decreased breath sounds improved post bronchodilator treatment. Musculoskeletal:     Cervical back: Neck supple.  Skin:    General: Skin is warm.     Findings: No rash.  Neurological:     Mental Status: She is alert and oriented to person, place, and time.  Psychiatric:        Behavior: Behavior normal.    The plan was reviewed with the patient/family, and all questions/concerned were addressed.  It was my  pleasure to see Emily Pineda today and participate in her care. Please feel free to contact me with any questions or concerns.  Sincerely,  Orlan Cramp, DO Allergy & Immunology  [facility]  [place] office: [phone] [facility] office: [phone]     [1]  Allergies Allergen Reactions   Other     Pain med gave hives and nausea

## 2024-10-31 NOTE — Progress Notes (Unsigned)
 "  Follow Up Note  RE: Emily Pineda MRN: [id number] DOB: Aug 26, 1961 Date of Office Visit: 11/01/2024  Referring provider: Millicent Sharper, MD Primary care provider: Millicent Sharper, MD  Chief Complaint: follow up  History of Present Illness: I had the pleasure of seeing Emily Pineda for a follow up visit at the [facility] on 11/01/2024. She is a 64 y.o. female, who is being followed for asthma, allergic rhinitis, recurrent sinus infections. Her previous allergy office visit was on 10/04/2024 with Dr. Luke. Today is a regular follow up visit.  Discussed the use of AI scribe software for clinical note transcription with the patient, who gave verbal consent to proceed.    She completed a course of steroids and currently uses Symbicort  160mg  inhaler, two puffs twice a day. She has used her emergency inhaler two to three times during severe episodes, which provided relief. No current coughing, wheezing, or shortness of breath, but she mentions a sensation of mucus or tightness in her throat that sometimes resolves with the inhaler but not always.   She has a history of reflux and takes over-the-counter pantoprazole  20 mg two to three times a week, especially if she eats late. She experiences intermittent symptoms that she suspects might be related to heartburn.  She takes allergy medication two to three times a week to prevent illness and has used Flonase  in the past when sick. She denies current use of Flonase  but keeps it available if needed.  She monitors her blood pressure, which fluctuates, and notes it tends to be higher in the evening. She has experienced significant drops in blood pressure with medication in the past. She associates increases in blood pressure with taking cold medications.   She plans to travel to [place] for four weeks and notes that her asthma can worsen there due to air quality, so she uses a mask.      2025 CXR: IMPRESSION: No active  cardiopulmonary disease.  Assessment and Plan: Emily Pineda is a 64 y.o. female with: Moderate persistent asthma without complication Past history - Asthma symptoms for many years and just recently started on daily Flovent . Uses albuterol prn and recently finished prednisone with some benefit. Albuterol causes palpitations and shaking which led to pouring hot water on herself causing skin damage. 2025 spirometry showed possible restrictive disease with 8% and 90cc improvement in FEV1 post bronchodilator treatment. Clinically feeling improved. 2025 CXR normal. Interim history - doing much better. Going to [place] for 1 month.  Today's spirometry showed some restriction - improved from previous one. Continue with Breyna  (generic Symbicort ) 160mcg 2 puffs twice a day. If Breyna  is still expensive then let me know.  Daily controller medication(s): decrease Symbicort  160mcg 2 puffs to ONCE a day with spacer and rinse mouth afterwards. If you notice issues then okay to go back to 2 puffs twice a day. During respiratory infections/flares:  Start Symbicort  160mcg 2 puffs twice a day for 1-2 weeks until your breathing symptoms return to baseline.  Pretreat with levoalbuterol nebulizer.  If you need to use your levoalbuterol nebulizer machine back to back within 15-30 minutes with no relief then please go to the ER/urgent care for further evaluation.  May use levoalbuterol rescue inhaler 2 puffs or nebulizer every 4 to 6 hours as needed for shortness of breath, chest tightness, coughing, and wheezing. May use albuterol rescue inhaler 2 puffs 5 to 15 minutes prior to strenuous physical activities. Monitor frequency of use - if you need  to use it more than twice per week on a consistent basis let us  know.    Other allergic rhinitis Past history- chronic allergic rhinitis with seasonal exacerbations. Current Flonase  use provides relief. S/p sinus surgery. Interim history - uses meds prn only. Use over the counter  antihistamines such as Zyrtec (cetirizine), Claritin (loratadine), Allegra (fexofenadine), or Xyzal (levocetirizine) daily as needed. May switch antihistamines every few months. Use Flonase  (fluticasone ) nasal spray 1-2 sprays per nostril once a day as needed for nasal congestion.  Nasal saline spray (i.e., Simply Saline) or nasal saline lavage (i.e., NeilMed) is recommended as needed and prior to medicated nasal sprays. Consider environmental allergy testing.   Recurrent sinus infections Keep track of infections and antibiotics use. If persistent will get bloodwork next to look at immune system.   GERD Intermittent symptoms possibly related to diet and medication. Symptoms may worsen with spicy foods and late meals. See handout for lifestyle and dietary modifications. Start pantoprazole  20mg  once day - nothing to eat or drink for 20-30 minutes afterwards.   Return in about 4 months (around 03/01/2025).  Meds ordered this encounter  Medications   pantoprazole  (PROTONIX ) 20 MG tablet    Sig: Take 1 tablet (20 mg total) by mouth daily.    Dispense:  30 tablet    Refill:  1   DISCONTD: budesonide -formoterol  (BREYNA ) 160-4.5 MCG/ACT inhaler    Sig: Inhale 2 puffs into the lungs in the morning and at bedtime. with spacer and rinse mouth afterwards.    Dispense:  1 each    Refill:  3   budesonide -formoterol  (BREYNA ) 160-4.5 MCG/ACT inhaler    Sig: Inhale 2 puffs into the lungs in the morning and at bedtime. with spacer and rinse mouth afterwards.    Dispense:  1 each    Refill:  3   Lab Orders  No laboratory test(s) ordered today    Diagnostics: Spirometry:  Tracings reviewed. Her effort: Good reproducible efforts. FVC: 1.72L FEV1: 1.35L, 67% predicted FEV1/FVC ratio: 78% Interpretation: Spirometry consistent with possible restrictive disease.  Please see scanned spirometry results for details.  Results discussed with patient/family.   Medication List:  Current Outpatient  Medications  Medication Sig Dispense Refill   budesonide -formoterol  (SYMBICORT ) 160-4.5 MCG/ACT inhaler Inhale 2 puffs into the lungs in the morning and at bedtime. with spacer and rinse mouth afterwards. 1 each 3   Cholecalciferol (VITAMIN D3) 10 MCG (400 UNIT) tablet Take 400 Units by mouth.     cyclobenzaprine  (FLEXERIL ) 10 MG tablet Take 1 tablet (10 mg total) by mouth 2 (two) times daily as needed for muscle spasms. 10 tablet 0   fluticasone  (FLONASE ) 50 MCG/ACT nasal spray Place 1-2 sprays into both nostrils daily as needed (nasal congestion). 16 g 5   ibuprofen  (ADVIL ,MOTRIN ) 800 MG tablet Take 1 tablet (800 mg total) by mouth 3 (three) times daily. 21 tablet 0   levalbuterol  (XOPENEX  HFA) 45 MCG/ACT inhaler Inhale 2 puffs into the lungs every 4 (four) hours as needed for wheezing or shortness of breath (coughing fits). 1 each 2   levalbuterol  (XOPENEX ) 0.63 MG/3ML nebulizer solution Take 3 mLs (0.63 mg total) by nebulization every 4 (four) hours as needed for wheezing or shortness of breath (coughing fits). 75 mL 1   loratadine (CLARITIN) 10 MG tablet Take 10 mg by mouth.     pantoprazole  (PROTONIX ) 20 MG tablet Take 1 tablet (20 mg total) by mouth daily. 30 tablet 1   silver sulfADIAZINE (SILVADENE) 1 % cream  SMARTSIG:1 Topical Daily     budesonide -formoterol  (BREYNA ) 160-4.5 MCG/ACT inhaler Inhale 2 puffs into the lungs in the morning and at bedtime. with spacer and rinse mouth afterwards. 1 each 3   No current facility-administered medications for this visit.   Allergies: Allergies[1] I reviewed her past medical history, social history, family history, and environmental history and no significant changes have been reported from her previous visit.  Review of Systems  Constitutional:  Negative for appetite change, chills, fever and unexpected weight change.  HENT:  Negative for congestion, postnasal drip and rhinorrhea.        Globus sensation  Eyes:  Negative for itching.   Respiratory:  Negative for chest tightness, shortness of breath and wheezing.   Cardiovascular:  Negative for chest pain.  Gastrointestinal:  Negative for abdominal pain.  Genitourinary:  Negative for difficulty urinating.  Skin:  Negative for rash.  Neurological:  Negative for headaches.    Objective: BP 122/80 (BP Location: Left Arm, Patient Position: Sitting, Cuff Size: Normal)   Pulse 84   Temp (!) 97.5 F (36.4 C) (Temporal)   Resp 18   Ht 5' 2 (1.575 m)   Wt 171 lb 1.9 oz (77.6 kg)   SpO2 98%   BMI 31.30 kg/m  Body mass index is 31.3 kg/m. Physical Exam Vitals and nursing note reviewed.  Constitutional:      Appearance: Normal appearance. She is well-developed.  HENT:     Head: Normocephalic and atraumatic.     Right Ear: Tympanic membrane and external ear normal.     Left Ear: Tympanic membrane and external ear normal.     Nose: Nose normal.     Mouth/Throat:     Mouth: Mucous membranes are moist.     Pharynx: Oropharynx is clear.  Eyes:     Conjunctiva/sclera: Conjunctivae normal.  Cardiovascular:     Rate and Rhythm: Normal rate and regular rhythm.     Heart sounds: Normal heart sounds. No murmur heard.    No friction rub. No gallop.  Pulmonary:     Effort: Pulmonary effort is normal.     Breath sounds: No wheezing, rhonchi or rales.  Musculoskeletal:     Cervical back: Neck supple.  Skin:    General: Skin is warm.     Findings: No rash.  Neurological:     Mental Status: She is alert and oriented to person, place, and time.  Psychiatric:        Behavior: Behavior normal.    Previous notes and tests were reviewed. The plan was reviewed with the patient/family, and all questions/concerned were addressed.  It was my pleasure to see Emily Pineda today and participate in her care. Please feel free to contact me with any questions or concerns.  Sincerely,  Orlan Cramp, DO Allergy & Immunology  [facility]  [facility] office:  [phone] [facility] office: [phone]     [1]  Allergies Allergen Reactions   Metformin And Related Other (See Comments)    dizziness   Other     Pain med gave hives and nausea   "

## 2024-11-01 ENCOUNTER — Encounter: Payer: Self-pay | Admitting: Allergy

## 2024-11-01 ENCOUNTER — Ambulatory Visit: Admitting: Allergy

## 2024-11-01 ENCOUNTER — Other Ambulatory Visit: Payer: Self-pay

## 2024-11-01 VITALS — BP 122/80 | HR 84 | Temp 97.5°F | Resp 18 | Ht 62.0 in | Wt 171.1 lb

## 2024-11-01 DIAGNOSIS — B999 Unspecified infectious disease: Secondary | ICD-10-CM | POA: Diagnosis not present

## 2024-11-01 DIAGNOSIS — J4540 Moderate persistent asthma, uncomplicated: Secondary | ICD-10-CM

## 2024-11-01 DIAGNOSIS — J3089 Other allergic rhinitis: Secondary | ICD-10-CM | POA: Diagnosis not present

## 2024-11-01 DIAGNOSIS — K219 Gastro-esophageal reflux disease without esophagitis: Secondary | ICD-10-CM

## 2024-11-01 MED ORDER — BUDESONIDE-FORMOTEROL FUMARATE 160-4.5 MCG/ACT IN AERO: 2.0000 | INHALATION_SPRAY | Freq: Two times a day (BID) | RESPIRATORY_TRACT | 3 refills | Status: DC

## 2024-11-01 MED ORDER — BUDESONIDE-FORMOTEROL FUMARATE 160-4.5 MCG/ACT IN AERO: 2.0000 | INHALATION_SPRAY | Freq: Two times a day (BID) | RESPIRATORY_TRACT | 3 refills | Status: AC

## 2024-11-01 MED ORDER — PANTOPRAZOLE SODIUM 20 MG PO TBEC: 20.0000 mg | DELAYED_RELEASE_TABLET | Freq: Every day | ORAL | 1 refills | Status: AC

## 2024-11-01 NOTE — Patient Instructions (Addendum)
 Breathing Your breathing test today is better.  Continue with Breyna  (generic Symbicort ) 160mcg 2 puffs twice a day. If Breyna  is still expensive then let me know.   Daily controller medication(s): decrease Symbicort  160mcg 2 puffs to ONCE a day with spacer and rinse mouth afterwards. If you notice issues then okay to go back to 2 puffs twice a day. During respiratory infections/flares:  Start Symbicort  160mcg 2 puffs twice a day for 1-2 weeks until your breathing symptoms return to baseline.  Pretreat with with levoalbuterol nebulizer.  If you need to use your levoalbuterol nebulizer machine back to back within 15-30 minutes with no relief then please go to the ER/urgent care for further evaluation.  May use levoalbuterol rescue inhaler 2 puffs or nebulizer every 4 to 6 hours as needed for shortness of breath, chest tightness, coughing, and wheezing. May use albuterol rescue inhaler 2 puffs 5 to 15 minutes prior to strenuous physical activities. Monitor frequency of use - if you need to use it more than twice per week on a consistent basis let us  know.  Breathing control goals:  Full participation in all desired activities (may need albuterol before activity) Albuterol use two times or less a week on average (not counting use with activity) Cough interfering with sleep two times or less a month Oral steroids no more than once a year No hospitalizations   Rhinitis Use over the counter antihistamines such as Zyrtec (cetirizine), Claritin (loratadine), Allegra (fexofenadine), or Xyzal (levocetirizine) daily as needed. May switch antihistamines every few months. Use Flonase  (fluticasone ) nasal spray 1-2 sprays per nostril once a day as needed for nasal congestion.  Nasal saline spray (i.e., Simply Saline) or nasal saline lavage (i.e., NeilMed) is recommended as needed and prior to medicated nasal sprays. Consider environmental allergy testing.  Skin testing instructions:  Will make additional  recommendations based on results. Make sure you don't take any antihistamines for 3 days before the skin testing appointment. Don't put any lotion on the back and arms on the day of testing.  Must be in good health and not ill. No vaccines/injections/antibiotics within the past 7 days.  Plan on being here for 30-60 minutes.   Infections Keep track of infections and antibiotics use. If persistent will get bloodwork next to look at immune system.   Reflux See handout for lifestyle and dietary modifications. Start pantoprazole  20mg  once day - nothing to eat or drink for 20-30 minutes afterwards.   Follow up in 4 months or sooner if needed.

## 2025-02-28 ENCOUNTER — Ambulatory Visit: Admitting: Allergy
